# Patient Record
Sex: Male | Born: 1950 | Race: Black or African American | Hispanic: No | Marital: Married | Smoking: Former smoker
Health system: Southern US, Community
[De-identification: ages and names within clinical notes are randomized; demographics above are authoritative.]

## PROBLEM LIST (undated history)

## (undated) DIAGNOSIS — I10 Essential (primary) hypertension: Secondary | ICD-10-CM

## (undated) HISTORY — PX: NO PAST SURGERIES: SHX2092

---

## 2001-08-11 ENCOUNTER — Emergency Department (HOSPITAL_COMMUNITY): Admission: EM | Admit: 2001-08-11 | Discharge: 2001-08-11 | Payer: Self-pay | Admitting: Emergency Medicine

## 2001-08-11 ENCOUNTER — Encounter: Payer: Self-pay | Admitting: Emergency Medicine

## 2006-01-26 ENCOUNTER — Ambulatory Visit: Payer: Self-pay | Admitting: Family Medicine

## 2012-12-21 ENCOUNTER — Encounter (HOSPITAL_COMMUNITY): Payer: Self-pay | Admitting: *Deleted

## 2012-12-21 ENCOUNTER — Inpatient Hospital Stay (HOSPITAL_COMMUNITY)
Admission: EM | Admit: 2012-12-21 | Discharge: 2012-12-23 | DRG: 373 | Disposition: A | Discharge status: Home / Self Care | Payer: MEDICAID | Attending: General Surgery | Admitting: General Surgery

## 2012-12-21 ENCOUNTER — Ambulatory Visit (HOSPITAL_COMMUNITY)
Admission: RE | Admit: 2012-12-21 | Discharge: 2012-12-21 | Disposition: A | Discharge status: Home / Self Care | Payer: Self-pay | Source: Ambulatory Visit | Attending: Family Medicine | Admitting: Family Medicine

## 2012-12-21 ENCOUNTER — Encounter (HOSPITAL_COMMUNITY): Payer: Self-pay

## 2012-12-21 ENCOUNTER — Other Ambulatory Visit: Payer: Self-pay | Admitting: Family Medicine

## 2012-12-21 ENCOUNTER — Other Ambulatory Visit: Payer: Self-pay

## 2012-12-21 ENCOUNTER — Emergency Department (HOSPITAL_COMMUNITY): Payer: Self-pay

## 2012-12-21 DIAGNOSIS — E119 Type 2 diabetes mellitus without complications: Secondary | ICD-10-CM | POA: Diagnosis present

## 2012-12-21 DIAGNOSIS — I10 Essential (primary) hypertension: Secondary | ICD-10-CM | POA: Diagnosis present

## 2012-12-21 DIAGNOSIS — K7689 Other specified diseases of liver: Secondary | ICD-10-CM | POA: Insufficient documentation

## 2012-12-21 DIAGNOSIS — R1031 Right lower quadrant pain: Secondary | ICD-10-CM | POA: Insufficient documentation

## 2012-12-21 DIAGNOSIS — D72829 Elevated white blood cell count, unspecified: Secondary | ICD-10-CM

## 2012-12-21 DIAGNOSIS — R509 Fever, unspecified: Secondary | ICD-10-CM

## 2012-12-21 DIAGNOSIS — K3532 Acute appendicitis with perforation and localized peritonitis, without abscess: Secondary | ICD-10-CM

## 2012-12-21 DIAGNOSIS — R1032 Left lower quadrant pain: Secondary | ICD-10-CM | POA: Insufficient documentation

## 2012-12-21 DIAGNOSIS — R102 Pelvic and perineal pain: Secondary | ICD-10-CM

## 2012-12-21 DIAGNOSIS — R935 Abnormal findings on diagnostic imaging of other abdominal regions, including retroperitoneum: Secondary | ICD-10-CM | POA: Insufficient documentation

## 2012-12-21 DIAGNOSIS — K352 Acute appendicitis with generalized peritonitis, without abscess: Principal | ICD-10-CM | POA: Diagnosis present

## 2012-12-21 DIAGNOSIS — K35209 Acute appendicitis with generalized peritonitis, without abscess, unspecified as to perforation: Principal | ICD-10-CM | POA: Diagnosis present

## 2012-12-21 DIAGNOSIS — R911 Solitary pulmonary nodule: Secondary | ICD-10-CM | POA: Insufficient documentation

## 2012-12-21 HISTORY — DX: Essential (primary) hypertension: I10

## 2012-12-21 LAB — CBC WITH DIFFERENTIAL/PLATELET
HCT: 43.4 % (ref 39.0–52.0)
Hemoglobin: 14.9 g/dL (ref 13.0–17.0)
Lymphs Abs: 1.7 10*3/uL (ref 0.7–4.0)
MCHC: 34.3 g/dL (ref 30.0–36.0)
Monocytes Absolute: 1.9 10*3/uL — ABNORMAL HIGH (ref 0.1–1.0)
Monocytes Relative: 13 % — ABNORMAL HIGH (ref 3–12)
Neutro Abs: 10.5 10*3/uL — ABNORMAL HIGH (ref 1.7–7.7)
Neutrophils Relative %: 75 % (ref 43–77)
RBC: 4.48 MIL/uL (ref 4.22–5.81)

## 2012-12-21 LAB — COMPREHENSIVE METABOLIC PANEL
Alkaline Phosphatase: 81 U/L (ref 39–117)
BUN: 16 mg/dL (ref 6–23)
CO2: 26 mEq/L (ref 19–32)
Chloride: 93 mEq/L — ABNORMAL LOW (ref 96–112)
Creatinine, Ser: 1.19 mg/dL (ref 0.50–1.35)
GFR calc Af Amer: 73 mL/min — ABNORMAL LOW (ref >90)
GFR calc non Af Amer: 63 mL/min — ABNORMAL LOW (ref >90)
Glucose, Bld: 83 mg/dL (ref 70–99)
Potassium: 3.5 mEq/L (ref 3.5–5.1)
Total Bilirubin: 0.8 mg/dL (ref 0.3–1.2)

## 2012-12-21 LAB — LIPASE, BLOOD: Lipase: 19 U/L (ref 11–59)

## 2012-12-21 MED ORDER — ACETAMINOPHEN 650 MG RE SUPP: 650.0000 mg | Freq: Four times a day (QID) | RECTAL | Status: DC | PRN

## 2012-12-21 MED ORDER — DIPHENHYDRAMINE HCL 50 MG/ML IJ SOLN: 12.5000 mg | Freq: Four times a day (QID) | INTRAMUSCULAR | Status: DC | PRN

## 2012-12-21 MED ORDER — HYDROCODONE-ACETAMINOPHEN 5-325 MG PO TABS: 1.0000 | ORAL_TABLET | ORAL | Status: DC | PRN

## 2012-12-21 MED ORDER — SODIUM CHLORIDE 0.9 % IV SOLN: Freq: Once | INTRAVENOUS | Status: DC

## 2012-12-21 MED ORDER — PIPERACILLIN-TAZOBACTAM 3.375 G IVPB
3.3750 g | Freq: Three times a day (TID) | INTRAVENOUS | Status: DC
  Administered 2012-12-22 – 2012-12-23 (×4): 3.375 g via INTRAVENOUS
  Filled 2012-12-21 (×11): qty 50

## 2012-12-21 MED ORDER — KCL IN DEXTROSE-NACL 20-5-0.45 MEQ/L-%-% IV SOLN
INTRAVENOUS | Status: DC
  Administered 2012-12-21: 1000 mL via INTRAVENOUS
  Administered 2012-12-22 – 2012-12-23 (×2): via INTRAVENOUS
  Filled 2012-12-21 (×6): qty 1000

## 2012-12-21 MED ORDER — PIPERACILLIN-TAZOBACTAM 3.375 G IVPB
3.3750 g | Freq: Once | INTRAVENOUS | Status: AC
  Administered 2012-12-21: 3.375 g via INTRAVENOUS
  Filled 2012-12-21: qty 50

## 2012-12-21 MED ORDER — IOHEXOL 300 MG/ML  SOLN
100.0000 mL | Freq: Once | INTRAMUSCULAR | Status: AC | PRN
  Administered 2012-12-21: 100 mL via INTRAVENOUS

## 2012-12-21 MED ORDER — SODIUM CHLORIDE 0.9 % IV BOLUS (SEPSIS)
1000.0000 mL | Freq: Once | INTRAVENOUS | Status: AC
  Administered 2012-12-21: 1000 mL via INTRAVENOUS

## 2012-12-21 MED ORDER — DIPHENHYDRAMINE HCL 12.5 MG/5ML PO ELIX: 12.5000 mg | ORAL_SOLUTION | Freq: Four times a day (QID) | ORAL | Status: DC | PRN

## 2012-12-21 MED ORDER — PIPERACILLIN-TAZOBACTAM 3.375 G IVPB
INTRAVENOUS | Status: AC
  Filled 2012-12-21: qty 50

## 2012-12-21 MED ORDER — ACETAMINOPHEN 325 MG PO TABS: 650.0000 mg | ORAL_TABLET | Freq: Four times a day (QID) | ORAL | Status: DC | PRN

## 2012-12-21 MED ORDER — ONDANSETRON HCL 4 MG/2ML IJ SOLN: 4.0000 mg | Freq: Four times a day (QID) | INTRAMUSCULAR | Status: DC | PRN

## 2012-12-21 MED ORDER — HYDROMORPHONE HCL PF 1 MG/ML IJ SOLN: 1.0000 mg | INTRAMUSCULAR | Status: DC | PRN

## 2012-12-21 MED ORDER — ENOXAPARIN SODIUM 40 MG/0.4ML ~~LOC~~ SOLN
40.0000 mg | SUBCUTANEOUS | Status: DC
  Administered 2012-12-21 – 2012-12-22 (×2): 40 mg via SUBCUTANEOUS
  Filled 2012-12-21 (×2): qty 0.4

## 2012-12-21 NOTE — H&P (Signed)
Subjective:   Patient is a 62 y.o. male presents with right lower quadrant abdominal pain. Onset of symptoms was one week ago with relief of the pain several days ago. He did return, though it is dull and achy in nature. The pain is located the right lower quadrant of the abdomen. Patient describes the pain as Intermittent and rated as mild.  He started out having diarrhea initially. He has had some fevers and chills. He was seen by his primary are physician 2 days ago and was thought to have prostatitis. He returned to his office today and a CT scan of the abdomen was ordered. He was found to have a ruptured appendix with localized phlegmon formation. He did not have diffuse pneumoperitoneum present. Past medical history is unremarkable.    There are no active problems to display for this patient.  Past Medical History  Diagnosis Date  . Hypertension     History reviewed. No pertinent past surgical history.   (Not in a hospital admission) Allergies not on file  History  Substance Use Topics  . Smoking status: Not on file  . Smokeless tobacco: Not on file  . Alcohol Use: No    History reviewed. No pertinent family history.  Review of Systems A comprehensive review of systems was negative.  Objective:   Patient Vitals for the past 8 hrs:  BP Temp Pulse Resp SpO2 Height Weight  12/21/12 2029 123/99 mmHg 100.2 F (37.9 C) 103  18  100 % 5\' 10"  (1.778 m) 86.183 kg (190 lb)          Well-developed well-nourished black male in no acute distress. Lungs clear to auscultation with equal breath sounds bilaterally. Heart examination reveals a regular rate and rhythm without S3, S4, murmurs. Abdomen is soft with mild tenderness noted right lower quadrant to deep palpation. No hepatosplenomegaly, masses, or rigidity are noted. No peritoneal signs are noted. .  Labs pending.  Assessment:   Impression: Acute appendicitis with perforation but no peritonitis.  Plan:   Patient does not  need an appendectomy at this time given the phlegmon formation and the fact that the appendix is oriented ruptured. He has no peritonitis. He'll be admitted to the hospital for IV Zosyn and monitoring. Further management is pending those results. I've explained to the patient is a treatment plan, and he is in full agreement.

## 2012-12-21 NOTE — ED Notes (Signed)
Pt had outpatient CT scan and per radiology pt has known ruptured appy.  CT was ordered by Benjamin Stain, NP of [facility].  Pt brought over here by CT for ?? Surgical consult

## 2012-12-21 NOTE — ED Notes (Signed)
Pt brought over from radiology.  Pt reporting abdominal pain, nausea and diarrhea that began on Tuesday.  Reports he did see his physician who sent him for a CT today.  Pt states at this time he is having no pain or nausea but does continue to have diarrhea.  Pt alert, orient, denies needs or concerns at present time.  IV in place, started in radiology.

## 2012-12-21 NOTE — ED Provider Notes (Addendum)
History   This chart was scribed for Dione Booze, MD by Sofie Rower, ED Scribe. The patient was seen in room [facility] and the patient's care was started at 8:28PM.     CSN: [id number]  Arrival date & time 12/21/12  2017   First MD Initiated Contact with Patient 12/21/12 2028      Chief Complaint  Patient presents with  . Ruptured Appy-S/P CT Scan    (Consider location/radiation/quality/duration/timing/severity/associated sxs/prior treatment) Patient is a 62 y.o. male presenting with general illness and abdominal pain. The history is provided by the patient. No language interpreter was used.  Illness  The current episode started today. The onset was sudden. The problem occurs rarely. The problem has been gradually worsening. The problem is severe. Nothing relieves the symptoms. Nothing aggravates the symptoms. Associated symptoms include abdominal pain and diarrhea. Pertinent negatives include no vomiting.  Abdominal Pain The primary symptoms of the illness include abdominal pain and diarrhea. The primary symptoms of the illness do not include vomiting. The current episode started more than 2 days ago (seven days ago). The onset of the illness was sudden. The problem has been gradually worsening.  The abdominal pain began more than 2 days ago (seven days ago). The pain came on suddenly. The abdominal pain has been gradually worsening since its onset. The abdominal pain is located in the RLQ. The abdominal pain does not radiate. The abdominal pain is relieved by nothing. The abdominal pain is exacerbated by bowel movements.  The diarrhea began 6 to 7 days ago (seven days ago). The diarrhea is watery. The diarrhea occurs continuously.  The illness is associated with recent antibiotic use. Additional symptoms associated with the illness include chills.    Ian Black is a 62 y.o. male , with a hx of hypertension and diabetes mellitus without complication, who presents to the Emergency  Department complaining of sudden, moderate, ruptured appendix, onset today (12/21/12).  Associated symptoms include diarrhea (multiple episodes), diffuse abdominal pain, fever (100.3 taken at home, 100.2, taken at [facility] this evening, 12/21/12), and chills (onset one week ago, 12/14/12). The pt reports he has been experiencing diffuse abdominal pain and multiple episodes of diarrhea over the past week, however, he was having an outpatient CT scan performed today, where it was discovered that he may have a partially ruptured appendix. The discovery prompted the pt's concern and desire to seek medical and surgical evaluation at [facility] this evening. The pt rates his abdominal pain at 6/10 at worst.   The pt denies experiencing any episodes of vomiting.   The pt does not smoke or drink alcohol.   PCP is Dr. Christell Constant.    Past Medical History  Diagnosis Date  . Hypertension     History reviewed. No pertinent past surgical history.  History reviewed. No pertinent family history.  History  Substance Use Topics  . Smoking status: Not on file  . Smokeless tobacco: Not on file  . Alcohol Use: No      Review of Systems  Constitutional: Positive for chills.  Gastrointestinal: Positive for abdominal pain and diarrhea. Negative for vomiting.  All other systems reviewed and are negative.    Allergies  Review of patient's allergies indicates not on file.  Home Medications  No current outpatient prescriptions on file.  BP 123/99  Pulse 103  Temp 100.2 F (37.9 C)  Resp 18  Ht 5\' 10"  (1.778 m)  Wt 190 lb (86.183 kg)  BMI 27.26 kg/m2  SpO2 100%  Physical Exam  Nursing note and vitals reviewed. Constitutional: He is oriented to person, place, and time. He appears well-developed and well-nourished. No distress.  HENT:  Head: Normocephalic and atraumatic.  Eyes: EOM are normal. Pupils are equal, round, and reactive to light.  Neck: Neck supple. No tracheal deviation present.  Cardiovascular:  Normal rate, regular rhythm and normal heart sounds.   Pulmonary/Chest: Effort normal and breath sounds normal. No respiratory distress.  Abdominal: Soft. He exhibits no distension. There is tenderness.       Abdominal tenderness detected at the RLQ.  Musculoskeletal: Normal range of motion. He exhibits no edema.  Neurological: He is alert and oriented to person, place, and time. No sensory deficit.  Skin: Skin is warm and dry.  Psychiatric: He has a normal mood and affect. His behavior is normal.    ED Course  Procedures (including critical care time)  DIAGNOSTIC STUDIES: Oxygen Saturation is 100% on room air, normal by my interpretation.    COORDINATION OF CARE:  8:34 PM- Treatment plan discussed with patient. Pt agrees with treatment.       Results for orders placed during the hospital encounter of 12/21/12  CBC WITH DIFFERENTIAL      Component Value Range   WBC 14.1 (*) 4.0 - 10.5 K/uL   RBC 4.48  4.22 - 5.81 MIL/uL   Hemoglobin 14.9  13.0 - 17.0 g/dL   HCT 57.8  46.9 - 62.9 %   MCV 96.9  78.0 - 100.0 fL   MCH 33.3  26.0 - 34.0 pg   MCHC 34.3  30.0 - 36.0 g/dL   RDW 52.8  41.3 - 24.4 %   Platelets 239  150 - 400 K/uL   Neutrophils Relative 75  43 - 77 %   Neutro Abs 10.5 (*) 1.7 - 7.7 K/uL   Lymphocytes Relative 12  12 - 46 %   Lymphs Abs 1.7  0.7 - 4.0 K/uL   Monocytes Relative 13 (*) 3 - 12 %   Monocytes Absolute 1.9 (*) 0.1 - 1.0 K/uL   Eosinophils Relative 0  0 - 5 %   Eosinophils Absolute 0.0  0.0 - 0.7 K/uL   Basophils Relative 0  0 - 1 %   Basophils Absolute 0.0  0.0 - 0.1 K/uL   Ct Abdomen Pelvis W Contrast  12/21/2012  *RADIOLOGY REPORT*  Clinical Data: Fever and pelvic pain.  Elevated white blood cell count.  CT ABDOMEN AND PELVIS WITH CONTRAST  Technique:  Multidetector CT imaging of the abdomen and pelvis was performed following the standard protocol during bolus administration of intravenous contrast.  Contrast: OMNIPAQUE IOHEXOL 300 MG/ML  SOLN   Comparison: None.  Findings: A 0.2 cm nodule is in the left lower lobe on image 6. The lung bases otherwise clear.  There is intense inflammatory process in the right lower quadrant with locules of free intraperitoneal air identified.  Findings are likely due to rupture of the tip appendicitis.  The walls of the distal ileum are markedly thickened which is likely secondary. Discrete rim enhancing fluid collection is not identified.  The colon is unremarkable.  The liver is diffusely low attenuating consistent with fatty infiltration.  No focal liver lesion is seen.  The gallbladder, adrenal glands, spleen, pancreas, biliary tree and kidneys appear normal. No lymphadenopathy is identified.  No focal bony abnormality is seen with degenerative disease in the lower lumbar spine.  IMPRESSION:  1.  Findings most consistent with acute appendicitis with  associated rupture of the tip of the appendix.  There are locules of free air seen in the right lower quadrant with phlegmon formation.  No discrete abscess identified.  Secondary inflammation of the ileum is identified. 2.  Diffuse fatty infiltration of the liver. 3.  0.2 cm left lower lobe nodule. If the patient is at high risk for bronchogenic carcinoma, follow-up chest CT at 1 year is recommended.  If the patient is at low risk, no follow-up is needed.  This recommendation follows the consensus statement: Guidelines for Management of Small Pulmonary Nodules Detected on CT Scans:  A Statement from the Fleischner Society as published in Radiology 2005; 237:395-400.  Critical Value/emergent results were called by telephone at the time of interpretation on 12/21/2012 at 8:10 p.m. to Paulene Floor, NP, who verbally acknowledged these results.   Original Report Authenticated By: Holley Dexter, M.D.    Dg Chest Portable 1 View  12/21/2012  *RADIOLOGY REPORT*  Clinical Data: Pre appendectomy evaluation.  PORTABLE CHEST - 1 VIEW  Comparison: None.  Findings: Normal sized  heart.  Clear lungs.  The lungs are mildly hyperexpanded.  Unremarkable bones.  IMPRESSION: Mild changes of COPD.  No acute abnormality.   Original Report Authenticated By: Beckie Salts, M.D.     Images viewed by me.  ECG shows normal sinus rhythm with a rate of 98, no ectopy. Normal axis. Normal P wave. Normal QRS. Normal intervals. Normal ST and T waves. Impression: normal ECG. No prior ECG available for comparison.   1. Ruptured appendicitis       MDM  Patient sent from CT scan with report of ruptured appendicitis. A reviewed the CT images which do show evidence of ruptured appendicitis. Exam is rather benign in spite of significant CT findings. He is given a dose of Zosyn and consultation is obtained with Dr. Lovell Sheehan who agrees to come to the ED to admit the patient.      I personally performed the services described in this documentation, which was scribed in my presence. The recorded information has been reviewed and is accurate.      Dione Booze, MD 12/21/12 2248  Dione Booze, MD 12/21/12 2248

## 2012-12-21 NOTE — ED Notes (Signed)
Surgeon at bedside.  

## 2012-12-22 LAB — URINALYSIS, ROUTINE W REFLEX MICROSCOPIC
Bilirubin Urine: NEGATIVE
Nitrite: NEGATIVE
Protein, ur: NEGATIVE mg/dL
pH: 5.5 (ref 5.0–8.0)

## 2012-12-22 LAB — CBC
HCT: 40.5 % (ref 39.0–52.0)
MCH: 34.2 pg — ABNORMAL HIGH (ref 26.0–34.0)
MCV: 95.5 fL (ref 78.0–100.0)
RBC: 4.24 MIL/uL (ref 4.22–5.81)
RDW: 13.4 % (ref 11.5–15.5)
WBC: 12.7 10*3/uL — ABNORMAL HIGH (ref 4.0–10.5)

## 2012-12-22 LAB — BASIC METABOLIC PANEL
BUN: 12 mg/dL (ref 6–23)
Creatinine, Ser: 1.04 mg/dL (ref 0.50–1.35)
GFR calc non Af Amer: 74 mL/min — ABNORMAL LOW (ref >90)
Glucose, Bld: 113 mg/dL — ABNORMAL HIGH (ref 70–99)
Potassium: 3.5 mEq/L (ref 3.5–5.1)

## 2012-12-22 MED ORDER — INFLUENZA VIRUS VACC SPLIT PF IM SUSP
0.5000 mL | INTRAMUSCULAR | Status: DC
  Filled 2012-12-22: qty 0.5

## 2012-12-22 MED ORDER — PIPERACILLIN-TAZOBACTAM 3.375 G IVPB
INTRAVENOUS | Status: AC
  Filled 2012-12-22: qty 50

## 2012-12-22 NOTE — Progress Notes (Signed)
UR Chart Review Completed  

## 2012-12-22 NOTE — Care Management Note (Unsigned)
    Page 1 of 1   12/22/2012     3:35:31 PM   CARE MANAGEMENT NOTE 12/22/2012  Patient:  Ian Black, Ian Black   Account Number:  [id number]  Date Initiated:  12/22/2012  Documentation initiated by:  Sharrie Rothman  Subjective/Objective Assessment:   Pt admitted from home with ruptured appendix. Pt lives with his wife and will return home at discharge. Pt is independent with ADL's.     Action/Plan:   Financial counselor aware of pts self pay status. Pts wife stated that she would buy pts medications at discharge.   Anticipated DC Date:  12/25/2012   Anticipated DC Plan:  HOME/SELF CARE  In-house referral  Financial Counselor      DC Planning Services  CM consult      Choice offered to / List presented to:             Status of service:  Completed, signed off Medicare Important Message given?   (If response is "NO", the following Medicare IM given date fields will be blank) Date Medicare IM given:   Date Additional Medicare IM given:    Discharge Disposition:    Per UR Regulation:    If discussed at Long Length of Stay Meetings, dates discussed:    Comments:  12/22/12 1534 Arlyss Queen, RN BSN CM

## 2012-12-23 LAB — CBC
Hemoglobin: 14 g/dL (ref 13.0–17.0)
MCH: 33.3 pg (ref 26.0–34.0)
MCHC: 34.8 g/dL (ref 30.0–36.0)
MCV: 95.7 fL (ref 78.0–100.0)
Platelets: 275 10*3/uL (ref 150–400)

## 2012-12-23 LAB — BASIC METABOLIC PANEL
Calcium: 8.6 mg/dL (ref 8.4–10.5)
Creatinine, Ser: 1.08 mg/dL (ref 0.50–1.35)
GFR calc non Af Amer: 71 mL/min — ABNORMAL LOW (ref >90)
Glucose, Bld: 96 mg/dL (ref 70–99)
Sodium: 137 mEq/L (ref 135–145)

## 2012-12-23 MED ORDER — METRONIDAZOLE 250 MG PO TABS: 250.0000 mg | ORAL_TABLET | Freq: Three times a day (TID) | ORAL | Status: DC

## 2012-12-23 NOTE — Progress Notes (Signed)
D/c instructions reviewed with patient and wife.  Verbalized understanding.  Pt dc'd to home with wife.  Schonewitz, Candelaria Stagers 12/23/2012

## 2012-12-23 NOTE — Discharge Summary (Signed)
Physician Discharge Summary  Patient ID: Ian [name] MRN: [id number] DOB/AGE: 03-17-1951 62 y.o.  Admit date: 12/21/2012 Discharge date: 12/23/2012  Admission Diagnoses: Perforated appendicitis  Discharge Diagnoses: Same Active Problems:  * No active hospital problems. *    Discharged Condition: good  Hospital Course: Patient is [name] 62 year old white male who was [facility] from [facility] after an outpatient CT scan was performed which revealed [name] perforated appendicitis. He apparently had been having symptoms for 1 week. When he presented emergency room, surgery was consulted. The patient was admitted to the hospital with the diagnosis of [name] perforated appendicitis with phlegmon formation. There was no abscess present. He was started on IV Zosyn. The patient has been afebrile while in the hospital. His white blood cell count has varied from 12,000-15,000. He denies any abdominal pain. His abdomen is soft. As clinically he is improved, the patient is being discharged home. He will be following up in my office for further management and treatment. There was no need to repeat the CAT scan at this time.  Discharge Exam: Blood pressure 125/82, pulse 76, temperature 98.4 F (36.9 C), temperature source Oral, resp. rate 16, height 5\' 10"  (1.778 m), weight 86.183 kg (190 lb), SpO2 98.00%. General appearance: alert, cooperative and no distress Resp: clear to auscultation bilaterally Cardio: regular rate and rhythm, S1, S2 normal, no murmur, click, rub or gallop GI: soft, non-tender; bowel sounds normal; no masses,  no organomegaly  Disposition: Home    Medication List     As of 12/23/2012 10:17 AM    TAKE these medications         ciprofloxacin 500 MG tablet   Commonly known as: CIPRO   Take 500 mg by mouth 2 (two) times daily.      metroNIDAZOLE 250 MG tablet   Commonly known as: FLAGYL   Take 1 tablet (250 mg total) by mouth 3 (three) times daily.           Follow-up Information    Follow up with Dalia Heading, MD. Schedule an appointment as soon as possible for [name] visit on 01/02/2013.   Contact information:   [address] [place] [address] [phone]          Signed: [name] 12/23/2012, 10:17 AM

## 2013-01-29 ENCOUNTER — Telehealth: Payer: Self-pay | Admitting: Internal Medicine

## 2013-01-30 ENCOUNTER — Institutional Professional Consult (permissible substitution): Payer: Self-pay | Admitting: Internal Medicine

## 2013-01-30 NOTE — Telephone Encounter (Signed)
MW in [place] office on Tues 4.1.14 Called spoke with patient's spouse Consult rescheduled for 4.1.14 @ 1000 Pt to arrive early for paperwork Wife to call sooner if needed Nothing further needed  Will sign off

## 2013-02-27 ENCOUNTER — Encounter: Payer: Self-pay | Admitting: Internal Medicine

## 2013-02-27 ENCOUNTER — Ambulatory Visit (INDEPENDENT_AMBULATORY_CARE_PROVIDER_SITE_OTHER): Payer: Self-pay | Admitting: Internal Medicine

## 2013-02-27 VITALS — BP 150/98 | HR 71 | Temp 98.2°F | Ht 70.5 in | Wt 199.0 lb

## 2013-02-27 DIAGNOSIS — R911 Solitary pulmonary nodule: Secondary | ICD-10-CM

## 2013-02-27 NOTE — Progress Notes (Signed)
  Subjective:    Patient ID: Ian Black, male    DOB: 08/19/1951 MRN: [id number]  HPI  15 yobm quit smoking 2011 no resp problems then abd complaints in Jan  2013 with ruptured appendix that apparently healed on it's own but resulted in a CT that showed a lung nodule in L base so referred 02/27/2013 to pulmonary clinic by Dr Modesto Charon   02/27/2013 1st pulmonary eval cc all better since abd problems resolved with no limiting sob, works out with wts on no medications   No obvious daytime variabilty or assoc chronic cough or cp or chest tightness, subjective wheeze overt sinus or hb symptoms. No unusual exp hx or h/o childhood pna/ asthma or premature birth to his knowledge.   Sleeping ok without nocturnal  or early am exacerbation  of respiratory  c/o's or need for noct saba. Also denies any obvious fluctuation of symptoms with weather or environmental changes or other aggravating or alleviating factors except as outlined above   Review of Systems  Constitutional: Negative for fever and unexpected weight change.  HENT: Positive for dental problem. Negative for ear pain, nosebleeds, congestion, sore throat, rhinorrhea, sneezing, trouble swallowing, postnasal drip and sinus pressure.   Eyes: Negative for redness and itching.  Respiratory: Negative for cough, chest tightness, shortness of breath and wheezing.   Cardiovascular: Negative for palpitations and leg swelling.  Gastrointestinal: Negative for nausea and vomiting.  Genitourinary: Negative for dysuria.  Musculoskeletal: Negative for joint swelling.  Skin: Negative for rash.  Neurological: Negative for headaches.  Hematological: Does not bruise/bleed easily.  Psychiatric/Behavioral: Negative for dysphoric mood. The patient is not nervous/anxious.        Objective:   Physical Exam  Wt Readings from Last 3 Encounters:  02/27/13 199 lb (90.266 kg)  12/21/12 190 lb (86.183 kg)    HEENT: nl dentition, turbinates, and orophanx. Nl  external ear canals without cough reflex   NECK :  without JVD/Nodes/TM/ nl carotid upstrokes bilaterally   LUNGS: no acc muscle use, clear to A and P bilaterally without cough on insp or exp maneuvers   CV:  RRR  no s3 or murmur or increase in P2, no edema   ABD:  soft and nontender with nl excursion in the supine position. No bruits or organomegaly, bowel sounds nl  MS:  warm without deformities, calf tenderness, cyanosis or clubbing  SKIN: warm and dry without lesions    NEURO:  alert, approp, no deficits     CT abd 12/21/12  0.2 cm left lower lobe nodule. If the patient is at high risk  for bronchogenic carcinoma, follow-up chest CT at 1 year is  recommended.        Assessment & Plan:

## 2013-02-27 NOTE — Patient Instructions (Addendum)
We will set you up for a CT chest at our office [facility] for a CT on [address] followed by office visit on [address] across from [place]

## 2013-02-28 DIAGNOSIS — R911 Solitary pulmonary nodule: Secondary | ICD-10-CM | POA: Insufficient documentation

## 2013-02-28 NOTE — Assessment & Plan Note (Addendum)
-   12/21/12 CT abd  0.2 cm left lower lobe nodule. If the patient is at high risk  for bronchogenic carcinoma, follow-up chest CT at 1 year is  Recommended.  Discussed in detail all the  indications, usual  risks and alternatives  relative to the benefits with patient who agrees to proceed with full CT chest in 6 months since only had a limited ct abd  (low but not zero risk considering smoking hx)   In tickle file for recall

## 2013-03-12 ENCOUNTER — Ambulatory Visit: Payer: Self-pay | Admitting: Family Medicine

## 2013-04-06 ENCOUNTER — Ambulatory Visit: Payer: Self-pay | Admitting: Family Medicine

## 2013-05-25 ENCOUNTER — Ambulatory Visit (INDEPENDENT_AMBULATORY_CARE_PROVIDER_SITE_OTHER): Payer: PRIVATE HEALTH INSURANCE | Admitting: General Practice

## 2013-05-25 ENCOUNTER — Ambulatory Visit (INDEPENDENT_AMBULATORY_CARE_PROVIDER_SITE_OTHER): Payer: PRIVATE HEALTH INSURANCE

## 2013-05-25 ENCOUNTER — Telehealth: Payer: Self-pay | Admitting: Family Medicine

## 2013-05-25 ENCOUNTER — Encounter: Payer: Self-pay | Admitting: General Practice

## 2013-05-25 VITALS — BP 150/97 | HR 72 | Temp 98.1°F | Ht 70.0 in | Wt 201.0 lb

## 2013-05-25 DIAGNOSIS — H6692 Otitis media, unspecified, left ear: Secondary | ICD-10-CM

## 2013-05-25 DIAGNOSIS — R05 Cough: Secondary | ICD-10-CM

## 2013-05-25 DIAGNOSIS — R059 Cough, unspecified: Secondary | ICD-10-CM

## 2013-05-25 DIAGNOSIS — J069 Acute upper respiratory infection, unspecified: Secondary | ICD-10-CM

## 2013-05-25 DIAGNOSIS — H6690 Otitis media, unspecified, unspecified ear: Secondary | ICD-10-CM

## 2013-05-25 MED ORDER — AMOXICILLIN 500 MG PO CAPS: 500.0000 mg | ORAL_CAPSULE | Freq: Two times a day (BID) | ORAL | Status: DC

## 2013-05-25 NOTE — Telephone Encounter (Signed)
appt made

## 2013-05-25 NOTE — Progress Notes (Signed)
  Subjective:    Patient ID: Ian Black, male    DOB: [birth year], 62 y.o.   MRN: [id number]  Cough This is a new problem. The current episode started in the past 7 days. The problem has been gradually worsening. The problem occurs every few minutes. Cough characteristics: productive yellowish  Associated symptoms include headaches, rhinorrhea and a sore throat. Pertinent negatives include no chest pain, chills, ear congestion, ear pain, fever, postnasal drip, shortness of breath or wheezing. Treatments tried: mucinex. There is no history of asthma, bronchitis or pneumonia.  Headache  This is a new problem. The current episode started in the past 7 days. The problem occurs intermittently. The problem has been unchanged. The pain is located in the frontal region. The pain does not radiate. The pain quality is similar to prior headaches. The quality of the pain is described as aching. The pain is at a severity of 3/10. Associated symptoms include back pain, coughing, dizziness, rhinorrhea, sinus pressure and a sore throat. Pertinent negatives include no abdominal pain, blurred vision, ear pain, fever, loss of balance, neck pain, phonophobia, photophobia or weakness. Nothing aggravates the symptoms. Treatments tried: after taking mucinex. The treatment provided no relief. His past medical history is significant for hypertension. There is no history of migraine headaches, recent head traumas or TMJ.      Review of Systems  Constitutional: Negative for fever and chills.  HENT: Positive for sore throat, rhinorrhea and sinus pressure. Negative for ear pain, neck pain, neck stiffness and postnasal drip.   Eyes: Negative for blurred vision and photophobia.  Respiratory: Positive for cough and chest tightness. Negative for shortness of breath and wheezing.   Cardiovascular: Negative for chest pain and palpitations.       Chest tightness  Gastrointestinal: Negative for abdominal pain and blood in stool.   Genitourinary: Negative for hematuria and difficulty urinating.  Musculoskeletal: Positive for back pain.       Chronic back pain  Neurological: Positive for dizziness and headaches. Negative for weakness and loss of balance.       Objective:   Physical Exam  Constitutional: He is oriented to person, place, and time. He appears well-developed and well-nourished.  HENT:  Head: Normocephalic and atraumatic.  Right Ear: External ear normal.  Left Ear: Tympanic membrane is erythematous.  Nose: Right sinus exhibits no maxillary sinus tenderness. Left sinus exhibits no maxillary sinus tenderness.  Mouth/Throat: Posterior oropharyngeal erythema present.  Neurological: He is alert and oriented to person, place, and time.  Skin: Skin is warm and dry.  Psychiatric: He has a normal mood and affect.   [facility] reading (PRIMARY) by Coralie Keens, FNP-C, no pneumothorax noted or acute disease.                                        Assessment & Plan:  1. Cough - DG Chest 2 View  2. Acute upper respiratory infections of unspecified site and 3. Left otitis media -amoxicillin 500mg  one tablet by mouth twice daily for 10 days -increase fluid intake -discussed hypertension and risk (patient reports taking blood pressure medication in the past, but discontinued the medication himself and refused to restart) -discussed regular exercise and healthy eating habits -RTO if symptoms worsen -Patient verbalized understanding -Coralie Keens, FNP-C

## 2013-05-25 NOTE — Patient Instructions (Addendum)

## 2013-12-12 ENCOUNTER — Telehealth: Payer: Self-pay | Admitting: *Deleted

## 2013-12-12 DIAGNOSIS — R911 Solitary pulmonary nodule: Secondary | ICD-10-CM

## 2013-12-12 NOTE — Telephone Encounter (Signed)
Spoke with the pt and notified due for ct chest  He verbalized understanding  [facility]

## 2013-12-12 NOTE — Telephone Encounter (Signed)
Message copied by Christen ButterASKIN, Halo Shevlin M on Wed Dec 12, 2013  9:40 AM ------      Message from: Sandrea HughsWERT, MICHAEL B      Created: Mon Aug 20, 2013  4:14 PM                   ----- Message -----         From: Nyoka CowdenMichael B Wert, MD         Sent: 08/10/2013   7:47 AM           To: Nyoka CowdenMichael B Wert, MD            Needs limited ct no contrast re spn ------

## 2013-12-18 ENCOUNTER — Inpatient Hospital Stay: Admission: RE | Admit: 2013-12-18 | Payer: Self-pay | Source: Ambulatory Visit

## 2014-01-18 ENCOUNTER — Other Ambulatory Visit: Payer: Self-pay

## 2015-03-17 ENCOUNTER — Other Ambulatory Visit: Payer: Self-pay | Admitting: Family Medicine

## 2015-03-17 ENCOUNTER — Ambulatory Visit
Admission: RE | Admit: 2015-03-17 | Discharge: 2015-03-17 | Disposition: A | Discharge status: Home / Self Care | Payer: Medicare Other | Source: Ambulatory Visit | Attending: Family Medicine | Admitting: Family Medicine

## 2015-03-17 DIAGNOSIS — R634 Abnormal weight loss: Secondary | ICD-10-CM | POA: Diagnosis not present

## 2015-03-17 DIAGNOSIS — J449 Chronic obstructive pulmonary disease, unspecified: Secondary | ICD-10-CM | POA: Diagnosis not present

## 2015-03-17 DIAGNOSIS — R194 Change in bowel habit: Secondary | ICD-10-CM | POA: Diagnosis not present

## 2015-03-17 DIAGNOSIS — R938 Abnormal findings on diagnostic imaging of other specified body structures: Secondary | ICD-10-CM | POA: Diagnosis not present

## 2015-03-17 DIAGNOSIS — R6889 Other general symptoms and signs: Secondary | ICD-10-CM | POA: Diagnosis not present

## 2015-03-17 DIAGNOSIS — Z87891 Personal history of nicotine dependence: Secondary | ICD-10-CM | POA: Diagnosis not present

## 2015-03-17 DIAGNOSIS — R911 Solitary pulmonary nodule: Secondary | ICD-10-CM

## 2015-05-19 DIAGNOSIS — R718 Other abnormality of red blood cells: Secondary | ICD-10-CM | POA: Diagnosis not present

## 2015-05-19 DIAGNOSIS — Z125 Encounter for screening for malignant neoplasm of prostate: Secondary | ICD-10-CM | POA: Diagnosis not present

## 2015-05-19 DIAGNOSIS — Z0000 Encounter for general adult medical examination without abnormal findings: Secondary | ICD-10-CM | POA: Diagnosis not present

## 2015-05-19 DIAGNOSIS — R7989 Other specified abnormal findings of blood chemistry: Secondary | ICD-10-CM | POA: Diagnosis not present

## 2015-05-19 DIAGNOSIS — Z136 Encounter for screening for cardiovascular disorders: Secondary | ICD-10-CM | POA: Diagnosis not present

## 2015-09-02 DIAGNOSIS — L309 Dermatitis, unspecified: Secondary | ICD-10-CM | POA: Diagnosis not present

## 2015-09-02 DIAGNOSIS — W57XXXA Bitten or stung by nonvenomous insect and other nonvenomous arthropods, initial encounter: Secondary | ICD-10-CM | POA: Diagnosis not present

## 2015-09-02 DIAGNOSIS — S80862A Insect bite (nonvenomous), left lower leg, initial encounter: Secondary | ICD-10-CM | POA: Diagnosis not present

## 2016-01-23 DIAGNOSIS — R42 Dizziness and giddiness: Secondary | ICD-10-CM | POA: Diagnosis not present

## 2016-08-31 IMAGING — CR DG CHEST 2V
3 series · 3 of 3 positions shown · non-contrast
Comparison: PA and lateral chest of June 24, 2013

CLINICAL DATA: History of pulmonary nodule. Previous tobacco use
now discontinued, asymptomatic.

EXAM:
CHEST  2 VIEW

[w chest pa]
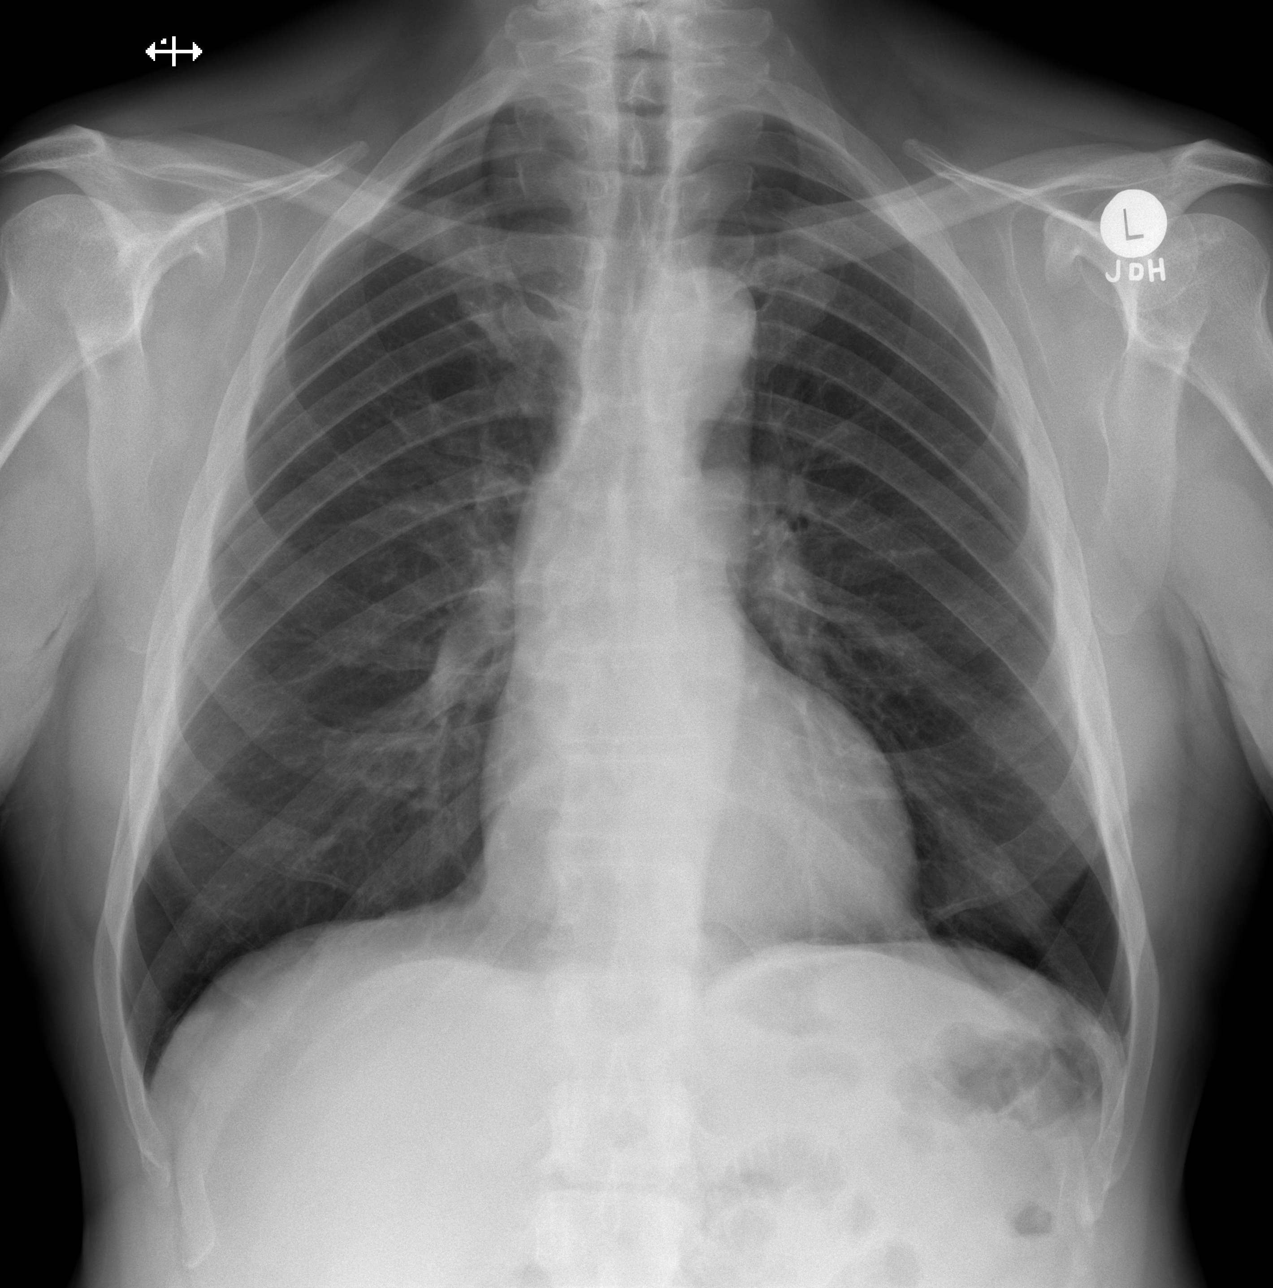

[w chest lat (1 of 2)]
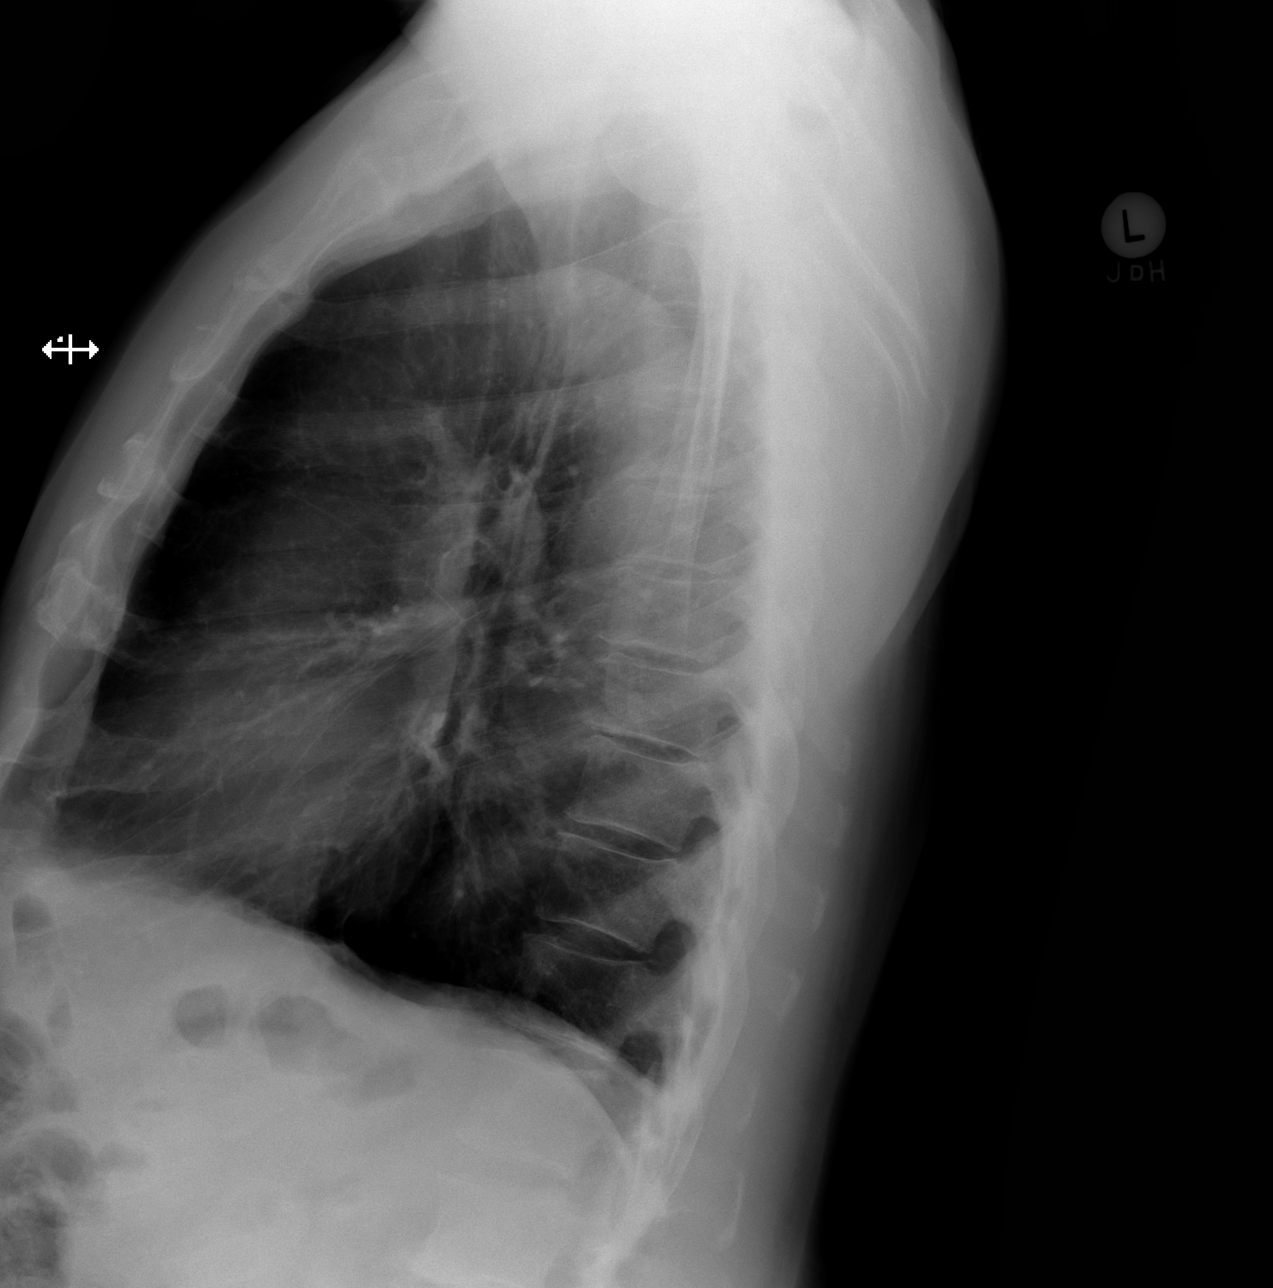

[w chest lat (2 of 2)]
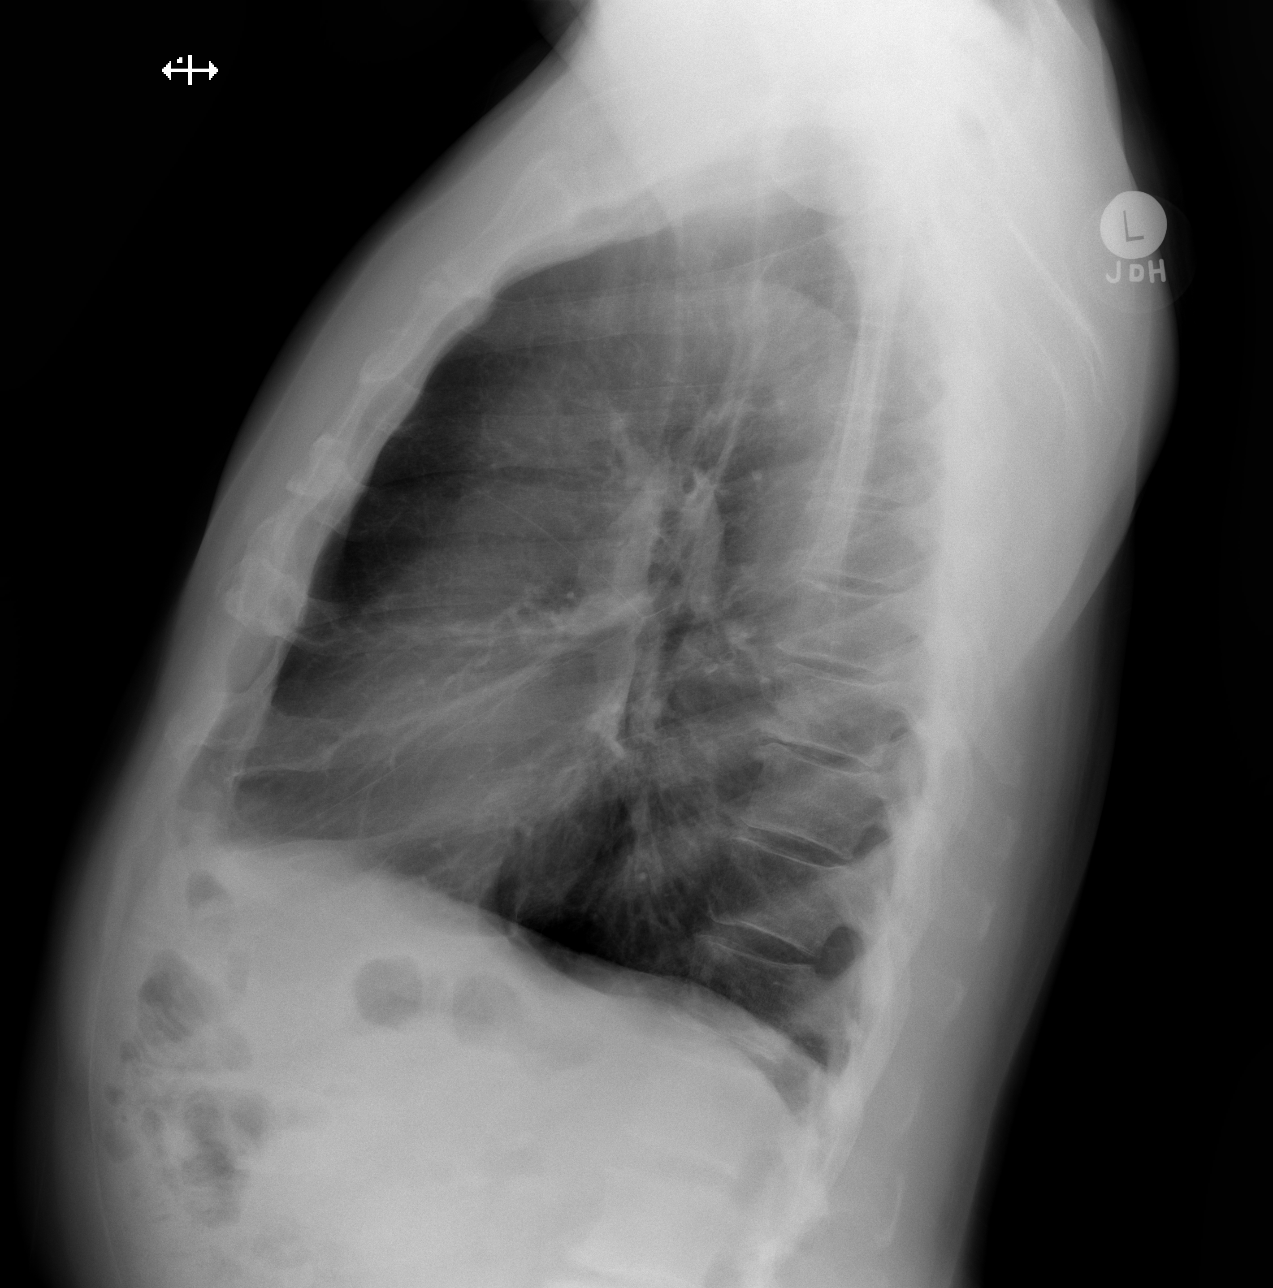

[3 of 3 positions shown; findings below may reference images not displayed]

FINDINGS: The lungs are mildly hyperinflated with hemidiaphragm flattening.
There is no focal infiltrate. The heart and mediastinal structures
are normal. There is no pleural effusion or pneumothorax. The bony
thorax is unremarkable.
IMPRESSION: COPD.  There is no active cardiopulmonary disease.

## 2017-10-10 DIAGNOSIS — Z87891 Personal history of nicotine dependence: Secondary | ICD-10-CM | POA: Diagnosis not present

## 2017-10-10 DIAGNOSIS — J209 Acute bronchitis, unspecified: Secondary | ICD-10-CM | POA: Diagnosis not present

## 2017-10-25 DIAGNOSIS — R05 Cough: Secondary | ICD-10-CM | POA: Diagnosis not present

## 2017-10-25 DIAGNOSIS — J209 Acute bronchitis, unspecified: Secondary | ICD-10-CM | POA: Diagnosis not present

## 2017-10-25 DIAGNOSIS — Z87891 Personal history of nicotine dependence: Secondary | ICD-10-CM | POA: Diagnosis not present

## 2020-02-13 DIAGNOSIS — M79672 Pain in left foot: Secondary | ICD-10-CM | POA: Diagnosis not present

## 2020-07-22 DIAGNOSIS — Z0100 Encounter for examination of eyes and vision without abnormal findings: Secondary | ICD-10-CM | POA: Diagnosis not present

## 2020-07-22 DIAGNOSIS — I69391 Dysphagia following cerebral infarction: Secondary | ICD-10-CM | POA: Diagnosis not present

## 2020-07-22 DIAGNOSIS — G529 Cranial nerve disorder, unspecified: Secondary | ICD-10-CM | POA: Diagnosis not present

## 2021-02-12 DIAGNOSIS — Z0100 Encounter for examination of eyes and vision without abnormal findings: Secondary | ICD-10-CM | POA: Diagnosis not present

## 2021-11-27 DIAGNOSIS — H52229 Regular astigmatism, unspecified eye: Secondary | ICD-10-CM | POA: Diagnosis not present

## 2021-11-27 DIAGNOSIS — Z0100 Encounter for examination of eyes and vision without abnormal findings: Secondary | ICD-10-CM | POA: Diagnosis not present

## 2021-11-27 DIAGNOSIS — H40023 Open angle with borderline findings, high risk, bilateral: Secondary | ICD-10-CM | POA: Diagnosis not present

## 2022-05-12 DIAGNOSIS — H401134 Primary open-angle glaucoma, bilateral, indeterminate stage: Secondary | ICD-10-CM | POA: Diagnosis not present

## 2022-05-12 DIAGNOSIS — H25812 Combined forms of age-related cataract, left eye: Secondary | ICD-10-CM | POA: Diagnosis not present

## 2022-05-12 DIAGNOSIS — H25811 Combined forms of age-related cataract, right eye: Secondary | ICD-10-CM | POA: Diagnosis not present

## 2022-05-13 DIAGNOSIS — Z0100 Encounter for examination of eyes and vision without abnormal findings: Secondary | ICD-10-CM | POA: Diagnosis not present

## 2023-08-24 ENCOUNTER — Encounter (HOSPITAL_COMMUNITY): Payer: Self-pay

## 2023-08-24 ENCOUNTER — Ambulatory Visit (HOSPITAL_COMMUNITY)
Admission: EM | Admit: 2023-08-24 | Discharge: 2023-08-24 | Disposition: A | Discharge status: Home / Self Care | Payer: Medicare HMO | Attending: Physician Assistant | Admitting: Physician Assistant

## 2023-08-24 DIAGNOSIS — M109 Gout, unspecified: Secondary | ICD-10-CM

## 2023-08-24 MED ORDER — PREDNISONE 20 MG PO TABS: 40.0000 mg | ORAL_TABLET | Freq: Every day | ORAL | 0 refills | Status: AC

## 2023-08-24 NOTE — Discharge Instructions (Signed)
Your blood pressure was elevated today. Check your blood pressure at home, you can record it on the sheet provided. Schedule a recheck with your primary care provide. Over the counter tylenol for pain, rest, elevate the foot.

## 2023-08-24 NOTE — ED Provider Notes (Signed)
[facility]    CSN: [id number] Arrival date & time: 08/24/23  1054      History   Chief Complaint Chief Complaint  Patient presents with   Foot Pain    HPI Ian Black is a 72 y.o. male.  Left foot pain, gradual onset 1-1.5 weeks ago, pain described as an ache, worse with ambulation, moving great toe, at worst 10 out of 10, waxes and wanes, better over last 2 days. Associated with redness and swelling. Similar to episode several years ago, dx as gout. PMH glaucoma, appendicitis. No meds, NKDA Has not seen a PCP in years, goes to [facility].    Foot Pain    Past Medical History:  Diagnosis Date   Hypertension     Patient Active Problem List   Diagnosis Date Noted   Solitary pulmonary nodule 02/28/2013    Past Surgical History:  Procedure Laterality Date   NO PAST SURGERIES         Home Medications    Prior to Admission medications   Medication Sig Start Date End Date Taking? Authorizing Provider  predniSONE (DELTASONE) 20 MG tablet Take 2 tablets (40 mg total) by mouth daily with breakfast for 5 days. 08/24/23 08/29/23 Yes Meliton Rattan, PA    Family History Family History  Problem Relation Age of Onset   Heart attack Mother    Alzheimer's disease Father    Diabetes type I Brother     Social History Social History   Tobacco Use   Smoking status: Former    Current packs/day: 0.00    Average packs/day: 1 pack/day for 50.0 years (50.0 ttl pk-yrs)    Types: Cigarettes    Start date: 11/30/1959    Quit date: 11/29/2009    Years since quitting: 13.7  Substance Use Topics   Alcohol use: No   Drug use: No     Allergies   Patient has no known allergies.   Review of Systems Review of Systems  Constitutional:  Positive for chills (slight chill 2 nights ago). Negative for appetite change and fever.  Gastrointestinal:  Negative for diarrhea, nausea and vomiting.  Skin:  Positive for color change. Negative for rash and wound.   Neurological:  Negative for numbness.     Physical Exam Triage Vital Signs ED Triage Vitals [08/24/23 1112]  Encounter Vitals Group     BP (!) 161/101     Systolic BP Percentile      Diastolic BP Percentile      Pulse Rate 70     Resp 18     Temp 97.8 F (36.6 C)     Temp Source Oral     SpO2 92 %     Weight      Height      Head Circumference      Peak Flow      Pain Score 10     Pain Loc      Pain Education      Exclude from Growth Chart    No data found.  Updated Vital Signs BP (!) 161/101 (BP Location: Left Arm)   Pulse 70   Temp 97.8 F (36.6 C) (Oral)   Resp 18   SpO2 92%   Visual Acuity Right Eye Distance:   Left Eye Distance:   Bilateral Distance:    Right Eye Near:   Left Eye Near:    Bilateral Near:     Physical Exam Vitals and nursing note reviewed.  Constitutional:      General: He is not in acute distress.    Appearance: Normal appearance. He is not ill-appearing or toxic-appearing.  HENT:     Head: Normocephalic.  Cardiovascular:     Rate and Rhythm: Normal rate and regular rhythm.     Heart sounds: Normal heart sounds.  Pulmonary:     Effort: Pulmonary effort is normal. No respiratory distress.     Breath sounds: Normal breath sounds. No wheezing or rales.  Musculoskeletal:     Comments: Left foot, redness, warmth, swelling dorsal foot, tender 1st MCPJ, pain with passive ROM great toe, good DP pulse, FROM foot and ankle, ankle no swelling or tenderness  Skin:    General: Skin is warm and dry.     Findings: Erythema present.  Neurological:     Mental Status: He is alert and oriented to person, place, and time.  Psychiatric:        Mood and Affect: Mood normal.      UC Treatments / Results  Labs (all labs ordered are listed, but only abnormal results are displayed) Labs Reviewed - No data to display  EKG   Radiology No results found.  Procedures Procedures (including critical care time)  Medications Ordered in  UC Medications - No data to display  Initial Impression / Assessment and Plan / UC Course  I have reviewed the triage vital signs and the nursing notes.  Pertinent labs & imaging results that were available during my care of the patient were reviewed by me and considered in my medical decision making (see chart for details). Pt noted to have elevated BP, states his BP are normal when he checks at home. Recommend he monitor BP at home, keep record and schedule f/u with PCP. OTC tylenol for pain. Rx Prednisone    Acute gout left foot Final Clinical Impressions(s) / UC Diagnoses   Final diagnoses:  Acute gout of left foot, unspecified cause     Discharge Instructions      Your blood pressure was elevated today. Check your blood pressure at home, you can record it on the sheet provided. Schedule a recheck with your primary care provide. Over the counter tylenol for pain, rest, elevate the foot.      ED Prescriptions     Medication Sig Dispense Auth. Provider   predniSONE (DELTASONE) 20 MG tablet Take 2 tablets (40 mg total) by mouth daily with breakfast for 5 days. 10 tablet Meliton Rattan, [place]      PDMP not reviewed this encounter.   Meliton Rattan, [place] 08/24/23 1142

## 2023-08-24 NOTE — ED Triage Notes (Signed)
Pt c/o lt foot pain, burning, swelling, and redness x1.5wks. States has been doing salt water soaks.

## 2023-08-30 DIAGNOSIS — M109 Gout, unspecified: Secondary | ICD-10-CM | POA: Diagnosis not present

## 2023-08-30 DIAGNOSIS — M25572 Pain in left ankle and joints of left foot: Secondary | ICD-10-CM | POA: Diagnosis not present

## 2023-10-03 DIAGNOSIS — M25572 Pain in left ankle and joints of left foot: Secondary | ICD-10-CM | POA: Diagnosis not present

## 2023-10-03 DIAGNOSIS — M109 Gout, unspecified: Secondary | ICD-10-CM | POA: Diagnosis not present

## 2023-10-25 DIAGNOSIS — M109 Gout, unspecified: Secondary | ICD-10-CM | POA: Diagnosis not present

## 2023-10-25 DIAGNOSIS — M25572 Pain in left ankle and joints of left foot: Secondary | ICD-10-CM | POA: Diagnosis not present

## 2023-11-28 DIAGNOSIS — H5231 Anisometropia: Secondary | ICD-10-CM | POA: Diagnosis not present

## 2023-11-28 DIAGNOSIS — H5212 Myopia, left eye: Secondary | ICD-10-CM | POA: Diagnosis not present

## 2023-11-28 DIAGNOSIS — H52223 Regular astigmatism, bilateral: Secondary | ICD-10-CM | POA: Diagnosis not present

## 2023-11-28 DIAGNOSIS — H5201 Hypermetropia, right eye: Secondary | ICD-10-CM | POA: Diagnosis not present

## 2023-11-28 DIAGNOSIS — H524 Presbyopia: Secondary | ICD-10-CM | POA: Diagnosis not present

## 2023-12-15 DIAGNOSIS — Z7982 Long term (current) use of aspirin: Secondary | ICD-10-CM | POA: Diagnosis not present

## 2023-12-15 DIAGNOSIS — M109 Gout, unspecified: Secondary | ICD-10-CM | POA: Diagnosis not present

## 2024-11-07 DIAGNOSIS — H5213 Myopia, bilateral: Secondary | ICD-10-CM | POA: Diagnosis not present

## 2024-11-07 DIAGNOSIS — H259 Unspecified age-related cataract: Secondary | ICD-10-CM | POA: Diagnosis not present

## 2024-11-07 DIAGNOSIS — H524 Presbyopia: Secondary | ICD-10-CM | POA: Diagnosis not present

## 2024-11-07 DIAGNOSIS — H5203 Hypermetropia, bilateral: Secondary | ICD-10-CM | POA: Diagnosis not present

## 2024-11-07 DIAGNOSIS — H52223 Regular astigmatism, bilateral: Secondary | ICD-10-CM | POA: Diagnosis not present
